# Patient Record
Sex: Male | Born: 1970 | Race: White | Hispanic: No | Marital: Married | Smoking: Former smoker
Health system: Southern US, Community
[De-identification: ages and names within clinical notes are randomized; demographics above are authoritative.]

## PROBLEM LIST (undated history)

## (undated) DIAGNOSIS — F329 Major depressive disorder, single episode, unspecified: Secondary | ICD-10-CM

## (undated) DIAGNOSIS — F32A Depression, unspecified: Secondary | ICD-10-CM

---

## 2003-02-25 ENCOUNTER — Emergency Department (HOSPITAL_COMMUNITY): Admission: EM | Admit: 2003-02-25 | Discharge: 2003-02-25 | Payer: Self-pay | Admitting: Emergency Medicine

## 2003-11-08 ENCOUNTER — Inpatient Hospital Stay (HOSPITAL_COMMUNITY): Admission: AD | Admit: 2003-11-08 | Discharge: 2003-11-11 | Payer: Self-pay | Admitting: Psychiatry

## 2003-11-09 ENCOUNTER — Encounter: Payer: Self-pay | Admitting: Family Medicine

## 2013-05-25 ENCOUNTER — Ambulatory Visit (HOSPITAL_COMMUNITY): Admission: RE | Admit: 2013-05-25 | Payer: Self-pay | Source: Home / Self Care | Admitting: Psychiatry

## 2013-05-25 ENCOUNTER — Encounter (HOSPITAL_COMMUNITY): Payer: Self-pay | Admitting: Emergency Medicine

## 2013-05-25 ENCOUNTER — Emergency Department (HOSPITAL_COMMUNITY)
Admission: EM | Admit: 2013-05-25 | Discharge: 2013-05-26 | Disposition: A | Discharge status: Other Acute Inpatient Hospital | Payer: Federal, State, Local not specified - Other | Attending: Emergency Medicine | Admitting: Emergency Medicine

## 2013-05-25 DIAGNOSIS — F332 Major depressive disorder, recurrent severe without psychotic features: Secondary | ICD-10-CM | POA: Diagnosis present

## 2013-05-25 DIAGNOSIS — F3289 Other specified depressive episodes: Secondary | ICD-10-CM | POA: Insufficient documentation

## 2013-05-25 DIAGNOSIS — R45851 Suicidal ideations: Secondary | ICD-10-CM | POA: Insufficient documentation

## 2013-05-25 DIAGNOSIS — F329 Major depressive disorder, single episode, unspecified: Secondary | ICD-10-CM | POA: Insufficient documentation

## 2013-05-25 DIAGNOSIS — Z79899 Other long term (current) drug therapy: Secondary | ICD-10-CM | POA: Insufficient documentation

## 2013-05-25 HISTORY — DX: Major depressive disorder, single episode, unspecified: F32.9

## 2013-05-25 HISTORY — DX: Depression, unspecified: F32.A

## 2013-05-25 LAB — COMPREHENSIVE METABOLIC PANEL
AST: 21 U/L (ref 0–37)
Albumin: 4.2 g/dL (ref 3.5–5.2)
BUN: 19 mg/dL (ref 6–23)
Calcium: 9.6 mg/dL (ref 8.4–10.5)
Chloride: 101 mEq/L (ref 96–112)
Creatinine, Ser: 0.86 mg/dL (ref 0.50–1.35)
Sodium: 137 mEq/L (ref 135–145)

## 2013-05-25 LAB — CBC
HCT: 43.5 % (ref 39.0–52.0)
MCH: 29.9 pg (ref 26.0–34.0)
MCHC: 35.9 g/dL (ref 30.0–36.0)
MCV: 83.5 fL (ref 78.0–100.0)
Platelets: 250 10*3/uL (ref 150–400)
RDW: 12.3 % (ref 11.5–15.5)
WBC: 7.8 10*3/uL (ref 4.0–10.5)

## 2013-05-25 LAB — SALICYLATE LEVEL: Salicylate Lvl: <2 mg/dL — ABNORMAL LOW (ref 2.8–20.0)

## 2013-05-25 LAB — ETHANOL: Alcohol, Ethyl (B): <11 mg/dL (ref 0–11)

## 2013-05-25 MED ORDER — ALUM & MAG HYDROXIDE-SIMETH 200-200-20 MG/5ML PO SUSP: 30.0000 mL | ORAL | Status: DC | PRN

## 2013-05-25 MED ORDER — LORAZEPAM 1 MG PO TABS
1.0000 mg | ORAL_TABLET | Freq: Three times a day (TID) | ORAL | Status: DC | PRN
  Administered 2013-05-25: 1 mg via ORAL
  Filled 2013-05-25: qty 1

## 2013-05-25 MED ORDER — NICOTINE 21 MG/24HR TD PT24: 21.0000 mg | MEDICATED_PATCH | Freq: Every day | TRANSDERMAL | Status: DC

## 2013-05-25 MED ORDER — ZOLPIDEM TARTRATE 5 MG PO TABS: 5.0000 mg | ORAL_TABLET | Freq: Every evening | ORAL | Status: DC | PRN

## 2013-05-25 MED ORDER — ONDANSETRON HCL 4 MG PO TABS: 4.0000 mg | ORAL_TABLET | Freq: Three times a day (TID) | ORAL | Status: DC | PRN

## 2013-05-25 MED ORDER — ACETAMINOPHEN 325 MG PO TABS: 650.0000 mg | ORAL_TABLET | ORAL | Status: DC | PRN

## 2013-05-25 MED ORDER — IBUPROFEN 200 MG PO TABS: 600.0000 mg | ORAL_TABLET | Freq: Three times a day (TID) | ORAL | Status: DC | PRN

## 2013-05-25 NOTE — ED Notes (Signed)
Patient presents pleasant, calm and cooperative; denies any current thoughts of self harm or thoughts of harming any one else; denies any auditory or visual hallucinations; denies any pain.

## 2013-05-25 NOTE — ED Notes (Signed)
Pt states that he has long history of depression and his friends have been worried about him so they wanted him to be seen for help. Pt was sent here from [facility] fro medical clearance but doesn't currently have a bed over there.

## 2013-05-25 NOTE — ED Provider Notes (Signed)
CSN: [id number]     Arrival date & time 05/25/13  1836 History  This chart was scribed for Marc Holloway, [place], with Audree Camel, MD, by Ardelia Mems ED Scribe. This patient was seen in room [facility]3/[facility] and the patient's care was started at 9:00 PM.    Chief Complaint  Patient presents with  . Medical Clearance    The history is provided by the patient. No language interpreter was used.    HPI Comments: Marc Holloway is a 42 y.o. Male with a history of depression who presents to the Emergency Department complaining of SI onset today. He states that he has a long history of depression but that he has no prior SI. He states that he was researching ways to commit suicide on the internet today, but that he backed out and "changed his mind", after reading that suicide attempts often fail. He states that his spouse saw the internet history from his searches today and requested that he be evaluated. He states that he has no history of suicide attempts. He states that he has no new stressors in his life. He states that he has no medical problems. He states that he takes Wellbutrin, which he states he doesn't believe is working. He states that he believes he would benefit from a change in medications. He states that he has been drug and alcohol free since 2006.    Past Medical History  Diagnosis Date  . Depression    History reviewed. No pertinent past surgical history. No family history on file. History  Substance Use Topics  . Smoking status: Never Smoker   . Smokeless tobacco: Never Used  . Alcohol Use: No    Review of Systems  Psychiatric/Behavioral: Positive for suicidal ideas. Negative for self-injury.       Denies HI.  All other systems reviewed and are negative.   Allergies  Review of patient's allergies indicates no known allergies.  Home Medications   Current Outpatient Rx  Name  Route  Sig  Dispense  Refill  . buPROPion (WELLBUTRIN SR) 150 MG 12 hr tablet   Oral    Take 150 mg by mouth daily.         . naproxen (NAPROSYN) 250 MG tablet   Oral   Take 375 mg by mouth 2 (two) times daily as needed (pain).          Triage Vitals: BP 125/84  Pulse 78  Temp(Src) 97.9 F (36.6 C) (Oral)  Resp 17  Ht 5\' 11"  (1.803 m)  Wt 205 lb (92.987 kg)  BMI 28.6 kg/m2  SpO2 99%  Physical Exam  Nursing note and vitals reviewed. Constitutional: He appears well-developed and well-nourished.  HENT:  Head: Normocephalic and atraumatic.  Eyes: Conjunctivae are normal. No scleral icterus.  Neck: Normal range of motion.  Cardiovascular: Normal rate, regular rhythm and normal heart sounds.   Pulmonary/Chest: Effort normal and breath sounds normal. No respiratory distress. He has no wheezes. He has no rales. He exhibits no tenderness.  Abdominal: Soft. Bowel sounds are normal. He exhibits no distension and no mass. There is no tenderness. There is no rebound and no guarding.  Musculoskeletal: Normal range of motion.  Neurological: He is alert.  Skin: Skin is warm and dry.  Psychiatric: He is not agitated and not aggressive. He exhibits a depressed mood. He expresses suicidal ideation. He expresses no homicidal ideation. He expresses no suicidal plans.    ED Course  Procedures (including critical care time)  DIAGNOSTIC STUDIES: Oxygen Saturation is 99% on RA, normal by my interpretation.    COORDINATION OF CARE: 9:05 PM- Pt advised of plan for treatment and pt agrees.  Labs Review Labs Reviewed  SALICYLATE LEVEL - Abnormal; Notable for the following:    Salicylate Lvl <2.0 (*)    All other components within normal limits  ACETAMINOPHEN LEVEL  CBC  COMPREHENSIVE METABOLIC PANEL  ETHANOL  URINE RAPID DRUG SCREEN (HOSP PERFORMED)   Imaging Review No results found.  EKG Interpretation   None       MDM   1. Suicidal ideation    Pt is medically clear.  Pt does have SI without a plan, however triage notes letter was found by his significant other  and pt stated he wanted his funeral and body cremated.  Psych Hold orders placed and call to TTS ordered. Pt moved to [facility] for further evaluation.  Disposition to be determined by [facility].  I personally performed the services described in this documentation, which was scribed in my presence. The recorded information has been reviewed and is accurate.    Marc Finner, PA-C 05/26/13 [id number]

## 2013-05-25 NOTE — BH Assessment (Signed)
Assessment Note  Marc Holloway is an 42 y.o. male who presents to the ED with suicidal ideations.  Pt currently lives with his partner and can return home once medically stable.  Pt denies HI/AH/VH.  Pt reports that he is here because his partner found a suicide note on the computer that he had written as well as a search history of how to shoot yourself.  Pt reports "once I read that most suicide attempts fail, and people just end up hurting themselves, I changed my mind".  Pt reports having lived with depression most of his life.  Pt reports some days "I'm fine and others I'm low".  Pt denies any particular triggers for his depression or thoughts of suicide.  Pt denies having attempted suicide, but reports that he has thought about it often.  Pt reports "I don't see any hope of anything getting any better".  Pt reports that he feels exhausted when he is feeling down but that "I still get up and go to work".  Pt reports that his appetite is fine and he has just started a diet "to not eat anything white".  Pt reports that he wakes up at least twice in an 8 hour period of sleeping.  Pt reports that his family is not supportive, and that "my partner and my sister try to be, but they don't really understand".  Pt reports that he has tried many medications for his depression and don't feel that they are effective.  Currently taking Wellbutrin.  Pt reports that he has tried talk therapy but doesn't feel that its effective.  Last saw a therapist 06/2012. Pt has a history of alcohol and crack use, but has not used since 2006.  CSW spoke with pts partner, Loraine Leriche and their friend Lanora Manis who accompanied pt to the ED.  Per Loraine Leriche, pt has been different over the last few days, and has been struggling with depression for a long time.  Loraine Leriche reports that the suicide note that he found was very detailed and "not something that he just wrote this morning".  Lanora Manis reports that although pt does not own a gun, that he did  report to her that he went shopping for one "right after Brandt Loosen died".     CSW consulted with [facility] extender, Donell Sievert who recommends inpatient treatment to [facility] pending the availability of a 500 hall bed.     Axis I: Major Depression, Recurrent severe Axis II: Deferred Axis III:  Past Medical History  Diagnosis Date  . Depression    Axis IV: other psychosocial or environmental problems, problems related to social environment and problems with primary support group Axis V: 31-40 impairment in reality testing  Past Medical History:  Past Medical History  Diagnosis Date  . Depression     History reviewed. No pertinent past surgical history.  Family History: No family history on file.  Social History:  reports that he has never smoked. He has never used smokeless tobacco. He reports that he does not drink alcohol or use illicit drugs.  Additional Social History:  Alcohol / Drug Use History of alcohol / drug use?: Yes Substance #1 Name of Substance 1: alcohol  1 - Age of First Use: 12 1 - Amount (size/oz): binge drinker 1 - Frequency: 3-4 times a week 1 - Duration: years 1 - Last Use / Amount: 10/14/2004 Substance #2 Name of Substance 2: Crack 2 - Age of First Use: 23 2 - Duration: years 2 - Last  Use / Amount: 10/19/2004  CIWA: CIWA-Ar BP: 125/84 mmHg Pulse Rate: 78 COWS:    Allergies: No Known Allergies  Home Medications:  (Not in a hospital admission)  OB/GYN Status:  No LMP for male patient.  General Assessment Data Location of Assessment: [facility] Is this a Tele or Face-to-Face Assessment?: Face-to-Face Is this an Initial Assessment or a Re-assessment for this encounter?: Initial Assessment Living Arrangements: Spouse/significant other Can pt return to current living arrangement?: Yes Admission Status: Voluntary Is patient capable of signing voluntary admission?: Yes Transfer from: Home Referral Source: Self/Family/Friend     [facility] Crisis Care  Plan Living Arrangements: Spouse/significant other     Risk to self Suicidal Ideation: No-Not Currently/Within Last 6 Months Suicidal Intent: No-Not Currently/Within Last 6 Months Is patient at risk for suicide?: Yes Suicidal Plan?: Yes-Currently Present Specify Current Suicidal Plan:  (pt was researching how to shoot himself on internet) Access to Means: No What has been your use of drugs/alcohol within the last 12 months?:  (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) Previous Attempts/Gestures: No How many times?: 0 Other Self Harm Risks: 0 Triggers for Past Attempts: None known Family Suicide History: Unknown Recent stressful life event(s): Other (Comment) (work stress) Persecutory voices/beliefs?: No Depression Symptoms: Insomnia;Fatigue;Guilt Substance abuse history and/or treatment for substance abuse?: Yes  Risk to Others Homicidal Ideation: No Thoughts of Harm to Others: No Current Homicidal Intent: No Current Homicidal Plan: No Access to Homicidal Means: No Identified Victim: none History of harm to others?: No Assessment of Violence: None Noted Violent Behavior Description: none Does patient have access to weapons?: No Criminal Charges Pending?: No Does patient have a court date: No  Psychosis Hallucinations: None noted Delusions: None noted  Mental Status Report Appear/Hygiene:  (unremarkable) Eye Contact: Fair Motor Activity: Agitation;Freedom of movement Speech: Logical/coherent Level of Consciousness: Alert Mood: Depressed Affect: Depressed Anxiety Level: None Thought Processes: Coherent;Relevant Judgement: Impaired Orientation: Person;Place;Time;Situation;Appropriate for developmental age Obsessive Compulsive Thoughts/Behaviors: None  Cognitive Functioning Concentration: Normal Memory: Recent Intact;Remote Intact IQ: Average Insight: Poor Impulse Control: Fair Appetite: Good Weight Loss: 0 Weight Gain: 0 Sleep: Decreased Total Hours of  Sleep: 3 (pt reports he gets a total 8 hours but wakes up after 3 hour) Vegetative Symptoms: None  ADLScreening [facility]) Patient's cognitive ability adequate to safely complete daily activities?: Yes Patient able to express need for assistance with ADLs?: Yes Independently performs ADLs?: Yes (appropriate for developmental age)  Prior Inpatient Therapy Prior Inpatient Therapy: Yes [facility]) Prior Therapy Dates:  (2006) Prior Therapy Facilty/Provider(s):  [facility]) Reason for Treatment:  (drug and alcohol treatment)  Prior Outpatient Therapy Prior Outpatient Therapy: Yes (pt reports he has seen many different therapist) Prior Therapy Dates: 2013 Prior Therapy Facilty/Provider(s):  [facility]) Reason for Treatment:  (Depression)  ADL Screening (condition at time of admission) Patient's cognitive ability adequate to safely complete daily activities?: Yes Patient able to express need for assistance with ADLs?: Yes Independently performs ADLs?: Yes (appropriate for developmental age)         Values / Beliefs Cultural Requests During Hospitalization: None Spiritual Requests During Hospitalization: None        Additional Information 1:1 In Past 12 Months?: No CIRT Risk: No Elopement Risk: No Does patient have medical clearance?: Yes     Disposition:  Disposition Initial Assessment Completed for this Encounter: Yes Disposition of Patient: Inpatient treatment program Type of inpatient treatment program: Adult  On Site Evaluation by:   Reviewed with  Physician:    Lexine Baton 05/25/2013 10:40 PM

## 2013-05-25 NOTE — ED Notes (Signed)
Pt wrote a note earlier today stating how he wanted his funeral and body cremated. Letter was found by his significant other.

## 2013-05-26 ENCOUNTER — Encounter (HOSPITAL_COMMUNITY): Payer: Self-pay

## 2013-05-26 ENCOUNTER — Encounter (HOSPITAL_COMMUNITY): Payer: Self-pay | Admitting: Registered Nurse

## 2013-05-26 ENCOUNTER — Inpatient Hospital Stay (HOSPITAL_COMMUNITY)
Admission: AD | Admit: 2013-05-26 | Discharge: 2013-05-31 | DRG: 885 | Disposition: A | Discharge status: Home / Self Care | Payer: Federal, State, Local not specified - Other | Source: Intra-hospital | Attending: Psychiatry | Admitting: Psychiatry

## 2013-05-26 DIAGNOSIS — R45851 Suicidal ideations: Secondary | ICD-10-CM

## 2013-05-26 DIAGNOSIS — F332 Major depressive disorder, recurrent severe without psychotic features: Secondary | ICD-10-CM | POA: Diagnosis present

## 2013-05-26 DIAGNOSIS — Z79899 Other long term (current) drug therapy: Secondary | ICD-10-CM

## 2013-05-26 LAB — RAPID URINE DRUG SCREEN, HOSP PERFORMED
Amphetamines: NOT DETECTED
Barbiturates: NOT DETECTED
Benzodiazepines: NOT DETECTED
Cocaine: NOT DETECTED
Opiates: NOT DETECTED
Tetrahydrocannabinol: NOT DETECTED

## 2013-05-26 MED ORDER — BUPROPION HCL ER (XL) 150 MG PO TB24
150.0000 mg | ORAL_TABLET | Freq: Every day | ORAL | Status: DC
  Administered 2013-05-27 – 2013-05-28 (×2): 150 mg via ORAL
  Filled 2013-05-26 (×4): qty 1

## 2013-05-26 MED ORDER — BUPROPION HCL ER (XL) 150 MG PO TB24
150.0000 mg | ORAL_TABLET | Freq: Every day | ORAL | Status: DC
  Administered 2013-05-26: 150 mg via ORAL
  Filled 2013-05-26: qty 1

## 2013-05-26 MED ORDER — ALUM & MAG HYDROXIDE-SIMETH 200-200-20 MG/5ML PO SUSP
30.0000 mL | ORAL | Status: DC | PRN
  Administered 2013-05-29: 30 mL via ORAL

## 2013-05-26 MED ORDER — TRAZODONE HCL 50 MG PO TABS
50.0000 mg | ORAL_TABLET | Freq: Every evening | ORAL | Status: DC | PRN
  Administered 2013-05-26: 50 mg via ORAL
  Filled 2013-05-26 (×15): qty 1

## 2013-05-26 MED ORDER — MAGNESIUM HYDROXIDE 400 MG/5ML PO SUSP: 30.0000 mL | Freq: Every day | ORAL | Status: DC | PRN

## 2013-05-26 MED ORDER — ACETAMINOPHEN 325 MG PO TABS: 650.0000 mg | ORAL_TABLET | Freq: Four times a day (QID) | ORAL | Status: DC | PRN

## 2013-05-26 NOTE — Progress Notes (Signed)
Adult Psychoeducational Group Note  Date:  05/26/2013 Time:  10:26 PM  Group Topic/Focus:  Wrap-Up Group:   The focus of this group is to help patients review their daily goal of treatment and discuss progress on daily workbooks.  Participation Level:  Active  Participation Quality:  Appropriate  Affect:  Appropriate  Cognitive:  Appropriate  Insight: Appropriate  Engagement in Group:  Engaged  Modes of Intervention:  Discussion  Additional Comments:The patient had just arrived from [facility]  Octavio Manns 05/26/2013, 10:26 PM
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
## 2013-05-26 NOTE — Progress Notes (Addendum)
Patient has been accepted to [facility] to Bed 506-1.  Incoming TTS will complete support paperwork.

## 2013-05-26 NOTE — ED Provider Notes (Signed)
Medical screening examination/treatment/procedure(s) were performed by non-physician practitioner and as supervising physician I was immediately available for consultation/collaboration.  EKG Interpretation   None         Ladarian Bonczek T Raywood Wailes, MD 05/26/13 1116 

## 2013-05-26 NOTE — Progress Notes (Signed)
[facility] provided pt with a [employer] of the [place].
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
## 2013-05-27 DIAGNOSIS — R45851 Suicidal ideations: Secondary | ICD-10-CM

## 2013-05-27 DIAGNOSIS — F1021 Alcohol dependence, in remission: Secondary | ICD-10-CM

## 2013-05-27 DIAGNOSIS — F332 Major depressive disorder, recurrent severe without psychotic features: Principal | ICD-10-CM

## 2013-05-27 MED ORDER — ARIPIPRAZOLE 5 MG PO TABS
5.0000 mg | ORAL_TABLET | Freq: Every day | ORAL | Status: DC
  Administered 2013-05-27 – 2013-05-28 (×2): 5 mg via ORAL
  Filled 2013-05-27 (×4): qty 1

## 2013-05-27 NOTE — Progress Notes (Signed)
Adult Psychoeducational Group Note  Date:  05/27/2013 Time:  11:00AM Group Topic/Focus:  Leisure and Lifestyle Changes  Participation Level:  Active  Participation Quality:  Appropriate and Attentive  Affect:  Appropriate  Cognitive:  Alert and Appropriate  Insight: Appropriate  Engagement in Group:  Engaged  Modes of Intervention:  Discussion  Additional Comments:  Pt. Was attentive and appropriate during today's group discussion. Pt was able to complete worksheet on self esteem. Pt. Was able to list positive thing about yourself and something you've done or good quality you have. Pt was able to work in group and come up with the following words loved, wellness and proud.    Bing Plume D 05/27/2013, 4:14 PM

## 2013-05-27 NOTE — Progress Notes (Signed)
D: Pt reports to be adjusting to the unit appropriately. He is observed participating in groups and interacting with others well. He is denying any SI/HI at this time. He has no concerns he wishes to address with this writer to at this time.  A: Pt was informed of continued order of Wellbutrin from home until further notice. Writer administered a prn dose of Trazodone for sleep. Continued support and availability as needed was extended to this pt. Staff continue to monitor pt with q59min checks.  R: No adverse drug reactions noted. Pt receptive to treatment. Pt remains safe at this time.

## 2013-05-27 NOTE — Progress Notes (Signed)
The focus of this group is to educate the patient on the purpose and policies of crisis stabilization and provide a format to answer questions about their admission.  The group details unit policies and expectations of patients while admitted.  Patient attended 0900 nurse education orientation group.  Patient actively participated, appropriate affect, alert, appropriate insight and engagement.  Today patient will work on goals for discharge.

## 2013-05-27 NOTE — [facility] Group Notes (Signed)
[facility] LCSW Group Therapy  [facility] 1:15 - 2:30 PM  05/27/2013 2:52 PM   Type of Therapy:  Group Therapy  Participation Level:  Minimal  Participation Quality:  Attentive  Affect:  Appropriate  Cognitive:  Appropriate  Insight:  Developing/Improving   Engagement in Therapy:  Developing/Improving   Modes of Intervention:  Discussion, Education, Exploration, Problem-Solving, Rapport Building, Support   Summary of Progress/Problems:  Patient listened attentively to speaker from [facility]. He made no comments on the presentaton  Wynn Banker 05/27/2013 2:52 PM

## 2013-05-27 NOTE — Progress Notes (Signed)
D:  Patient's self inventory sheet, patient has poor sleep, good appetite, low energy level, good attention span.  Rated depression 5, hopeless 6, denied anxiety.  Denied withdrawals.  SI, contracts for safety.  Has experienced headache in past 24 hours.  Worst pain 2.  After discharge, plans to go to more meetings AA, stay off sugar and wheat.   A:  Medications administered per MD orders.  Emotional support and encouragement given patient. R:  Denied HI.  Denied A/V hallucinations.  Will continue to monitor for safety with 15 minute checks.  Safety maintained. SI off/on, contracts for safety. \
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
## 2013-05-27 NOTE — Progress Notes (Signed)
Patient observed resting in bed. Patient stated that he had trouble sleeping the previous night. Patient attended groups. Patient states "I had a productive day. I completed the packet and did additional journaling". Patient states that he will work on building his support system. Denies SI, HI, AVH. Rates anxiety and depression at 0/10.  Encouragement offered. Patient declined Trazadone stating that he felt it was ineffective.  Patient safety maintained on the unit. Q 15 minute checks continue.

## 2013-05-28 MED ORDER — HYDROXYZINE HCL 50 MG PO TABS
50.0000 mg | ORAL_TABLET | Freq: Every evening | ORAL | Status: DC | PRN
  Administered 2013-05-29 – 2013-05-30 (×2): 50 mg via ORAL
  Filled 2013-05-28 (×2): qty 1
  Filled 2013-05-28: qty 14

## 2013-05-28 MED ORDER — BUPROPION HCL ER (XL) 300 MG PO TB24
300.0000 mg | ORAL_TABLET | Freq: Every day | ORAL | Status: DC
  Administered 2013-05-29 – 2013-05-31 (×3): 300 mg via ORAL
  Filled 2013-05-28 (×2): qty 1
  Filled 2013-05-28: qty 14
  Filled 2013-05-28 (×2): qty 1

## 2013-05-28 NOTE — Progress Notes (Signed)
[facility] MD Progress Note  05/28/2013 1:12 PM Marc Holloway  MRN:  [id number] Subjective:  Patient stated that he is feeling less anxious but continued to be depressed. Patient stated he spoke with his partner who has cleared his dogs about getting married and now he feels better. Patient requested to increase his Wellbutrin and stop his Abilify because of cost constraint  after discharged. Patient endorses symptoms of depression and anxiety but the contract for safety regarding suicidal ideation.  Diagnosis:   DSM5: Schizophrenia Disorders:   Obsessive-Compulsive Disorders:   Trauma-Stressor Disorders:   Substance/Addictive Disorders:   Depressive Disorders:  Major Depressive Disorder - Severe (296.23)  Axis I: Major Depression, Recurrent severe  ADL's:  Intact  Sleep: Fair  Appetite:  Fair  Suicidal Ideation:  Endorses suicidal ideation but contracts for safety in the hospital Homicidal Ideation:  Denied AEB (as evidenced by):  Psychiatric Specialty Exam: ROS  Blood pressure 135/78, pulse 114, temperature 97 F (36.1 C), temperature source Oral, resp. rate 16, height 5\' 10"  (1.778 m), weight 94.348 kg (208 lb).Body mass index is 29.84 kg/(m^2).  General Appearance: Fairly Groomed and Guarded  Patent attorney::  Minimal  Speech:  Clear and Coherent and Slow  Volume:  Decreased  Mood:  Anxious, Depressed, Hopeless and Worthless  Affect:  Depressed and Flat  Thought Process:  Goal Directed and Intact  Orientation:  Full (Time, Place, and Person)  Thought Content:  Rumination  Suicidal Thoughts:  Yes.  with intent/plan  Homicidal Thoughts:  No  Memory:  Immediate;   Fair  Judgement:  Intact  Insight:  Fair  Psychomotor Activity:  Psychomotor Retardation  Concentration:  Fair  Recall:  Fair  Akathisia:  NA  Handed:  Right  AIMS (if indicated):     Assets:  Communication Skills Desire for Improvement Financial Resources/Insurance Physical Health Social Support  Sleep:   Number of Hours: 6.75   Current Medications: Current Facility-Administered Medications  Medication Dose Route Frequency Provider Last Rate Last Dose  . acetaminophen (TYLENOL) tablet 650 mg  650 mg Oral Q6H PRN Shuvon Rankin, NP      . alum & mag hydroxide-simeth (MAALOX/MYLANTA) 200-200-20 MG/5ML suspension 30 mL  30 mL Oral Q4H PRN Shuvon Rankin, NP      . [START ON 05/29/2013] buPROPion (WELLBUTRIN XL) 24 hr tablet 300 mg  300 mg Oral Daily Nehemiah Settle, MD      . hydrOXYzine (ATARAX/VISTARIL) tablet 50 mg  50 mg Oral QHS PRN Nehemiah Settle, MD      . magnesium hydroxide (MILK OF MAGNESIA) suspension 30 mL  30 mL Oral Daily PRN Shuvon Rankin, NP      . traZODone (DESYREL) tablet 50 mg  50 mg Oral QHS,MR X 1 Spencer E Simon, PA-C   50 mg at 05/26/13 2126    Lab Results: No results found for this or any previous visit (from the past 48 hour(s)).  Physical Findings: AIMS: Facial and Oral Movements Muscles of Facial Expression: None, normal Lips and Perioral Area: None, normal Jaw: None, normal Tongue: None, normal,Extremity Movements Upper (arms, wrists, hands, fingers): None, normal Lower (legs, knees, ankles, toes): None, normal, Trunk Movements Neck, shoulders, hips: None, normal, Overall Severity Severity of abnormal movements (highest score from questions above): None, normal Incapacitation due to abnormal movements: None, normal Patient's awareness of abnormal movements (rate only patient's report): No Awareness, Dental Status Current problems with teeth and/or dentures?: No Does patient usually wear dentures?: No  CIWA:  CIWA-Ar  Total: 1 COWS:  COWS Total Score: 2  Treatment Plan Summary: Daily contact with patient to assess and evaluate symptoms and progress in treatment Medication management  Plan: Treatment Plan/Recommendations:   1. Admit for crisis management and stabilization. 2. Medication management to reduce current symptoms to base line  and improve the patient's overall level of functioning. Increase Wellbutrin to 300 mg daily continue trazodone 50 mg at bedtime and discontinue Abilify. 3. Treat health problems as indicated. 4. Develop treatment plan to decrease risk of relapse upon discharge and to reduce the need for readmission. 5. Psycho-social education regarding relapse prevention and self care. 6. Health care follow up as needed for medical problems. 7. Restart home medications where appropriate.   Medical Decision Making Problem Points:  Established problem, worsening (2), Review of last therapy session (1) and Review of psycho-social stressors (1) Data Points:  Review or order clinical lab tests (1) Review or order medicine tests (1) Review of medication regiment & side effects (2) Review of new medications or change in dosage (2)  I certify that inpatient services furnished can reasonably be expected to improve the patient's condition.   Marc Holloway,Marc R. 05/28/2013, 1:12 PM

## 2013-05-28 NOTE — Progress Notes (Signed)
Patient ID: Marc Holloway, male   DOB: 05/10/1971, 42 y.o.   MRN: [id number]  D: Patient pleasant and cooperative with assessment, but does endorse depression with flat affect. Pt denies SI at this time. No s/s of distress noted. A: Q 15 minute safety checks for safety, encourage staff/peer interaction, group participation, and administer medications as ordered by MD. R: Patient declined to take his Trazodone due to stating that he did not want to feel groggy tomorrow. Pt verbally contracts for safety.

## 2013-05-28 NOTE — Tx Team (Addendum)
Interdisciplinary Treatment Plan Update   Date Reviewed:  05/28/2013  Time Reviewed:  9:40 AM  Progress in Treatment:   Attending groups: Yes Participating in groups: Yes Taking medication as prescribed: Yes, contact made with partner. Tolerating medication: Yes Family/Significant other contact made: Yes  Patient understands diagnosis: Yes  Discussing patient identified problems/goals with staff: Yes Medical problems stabilized or resolved: Yes Denies suicidal/homicidal ideation: Yes Patient has not harmed self or others: Yes  For review of initial/current patient goals, please see plan of care.  Estimated Length of Stay: 3-4 days   Reasons for Continued Hospitalization:  Anxiety Depression Medication stabilization  New Problems/Goals identified:    Discharge Plan or Barriers:   Home with outpatient follow up with [facility]  Additional Comments:  Marc Holloway is a 42 year old white male who presented to the [facility] at the request of his partner who found him searching on the internet for ways to commit suicide. Braian endorses a "lifetime" of depression but states his symptoms have worsened over the past several months to include feeling helpless, with racing thoughts, crying, inability to make a decision, increased fatigue with no motivation, increased anhedonia, mood swings with irritability and being over whelmed with what others think of him. He states he does not have a plan to kill himself reporting that his research shows suicide attempts are more likely to end up in injury than death. He states suicide is not an option for him.   Attendees:  Patient:  05/28/2013 9:40 AM   Signature: Mervyn Gay, MD 05/28/2013 9:40 AM  Signature:  Verne Spurr, PA 05/28/2013 9:40 AM  Signature: Lamount Cranker, RN  05/28/2013 9:40 AM  Signature: 05/28/2013 9:40 AM  Signature:   05/28/2013 9:40 AM  Signature:  Juline Patch, LCSW 05/28/2013 9:40 AM   Signature:   05/28/2013 9:40 AM  Signature:  05/28/2013 9:40 AM  Signature:   05/28/2013 9:40 AM  Signature:  05/28/2013  9:40 AM  Signature:    05/28/2013  9:40 AM  Signature:   05/28/2013  9:40 AM    Scribe for Treatment Team:   Juline Patch,  05/28/2013 9:40 AM

## 2013-05-28 NOTE — [facility] Group Notes (Signed)
[facility] LCSW Aftercare Discharge Planning Group Note   05/28/2013 11:52 AM    Participation Quality:  Appropraite  Mood/Affect:  Appropriate  Depression Rating:  2  Anxiety Rating:  0  Thoughts of Suicide:  No  Will you contract for safety?   NA  Current AVH:  No  Plan for Discharge/Comments:  Patient attending discharge planning group and actively participated in group.  He advised of needing referral for outpatient services.  CSW provided all participants with daily workbook.  Transportation Means: Patient has transportation.   Supports:  Patient has a support system.   Marc Holloway, Joesph July

## 2013-05-28 NOTE — [facility] Suicide Risk Assessment (Signed)
[facility] INPATIENT:  Family/Significant Other Suicide Prevention Education  Suicide Prevention Education:  Education Completed; Mikey Bussing, Partner, [phone]; has been identified by the patient as the family member/significant other with whom the patient will be residing, and identified as the person(s) who will aid the patient in the event of a mental health crisis (suicidal ideations/suicide attempt).  With written consent from the patient, the family member/significant other has been provided the following suicide prevention education, prior to the and/or following the discharge of the patient.  The suicide prevention education provided includes the following:  Suicide risk factors  Suicide prevention and interventions  National Suicide Hotline telephone number  [facility] assessment telephone number  [facility] Emergency Assistance 911  [facility] and/or Residential Mobile Crisis Unit telephone number  Request made of family/significant other to:  Remove weapons (e.g., guns, rifles, knives), all items previously/currently identified as safety concern.  Partner advised patient does not have access to weapons.  Remove drugs/medications (over-the-counter, prescriptions, illicit drugs), all items previously/currently identified as a safety concern.  The family member/significant other verbalizes understanding of the suicide prevention education information provided.  The family member/significant other agrees to remove the items of safety concern listed above.  Wynn Banker 05/28/2013, 8:32 AM

## 2013-05-28 NOTE — Progress Notes (Signed)
Adult Psychoeducational Group Note  Date:  05/28/2013 Time:  10:00am Group Topic/Focus:  Therapeutic Activity  Participation Level:  Active  Participation Quality:  Appropriate and Attentive  Affect:  Appropriate  Cognitive:  Alert and Appropriate  Insight: Appropriate  Engagement in Group:  Engaged  Modes of Intervention:  Discussion and Education  Additional Comments:  Patient attended and participated in group. Activity today was activity ball. Pt was asked to answer any question on the ball. Pt answered the question What is a positive short term goal? Pt stated to break the pattern of isolation and attend more AA meetings.  Shelly Bombard D 05/28/2013, 11:25 AM

## 2013-05-28 NOTE — [facility] Group Notes (Signed)
[facility] LCSW Group Therapy  Feelings Around Relapse 1:15 -2:30          05/28/2013  4:09 PM   Type of Therapy:  Group Therapy  Participation Level:  Appropriate  Participation Quality:  Appropriate  Affect:  Appropriate  Cognitive:  Attentive Appropriate  Insight:  Developing/Improving  Engagement in Therapy: Developing/Improving  Modes of Intervention:  Discussion Exploration Problem-Solving Supportive  Summary of Progress/Problems:  The topic for today was feelings around relapse.    Patient processed feelings toward relapse and was able to relate to peers. He shared relapse for him means isolating from partner and friends.  Patient identified coping skills that can be used to prevent a relapse.   Wynn Banker 05/28/2013    4:09 PM

## 2013-05-28 NOTE — Progress Notes (Signed)
Adult Psychoeducational Group Note  Date:  05/28/2013 Time:  8:00pm Group Topic/Focus:  Wrap-Up Group:   The focus of this group is to help patients review their daily goal of treatment and discuss progress on daily workbooks.  Participation Level:  Active  Participation Quality:  Appropriate and Attentive  Affect:  Appropriate  Cognitive:  Alert and Appropriate  Insight: Appropriate  Engagement in Group:  Engaged  Modes of Intervention:  Discussion and Education  Additional Comments: Pt attended and participated in group. Writer explained to each patient about 15 minute checks, environmental, and keeping the noise level at a minimal. Pt was asked How his day went? Pt stated it was a good day but the new medicine he is on made him really sleepy.    Shelly Bombard D 05/28/2013, 9:38 PM

## 2013-05-29 DIAGNOSIS — F411 Generalized anxiety disorder: Secondary | ICD-10-CM

## 2013-05-29 MED ORDER — IBUPROFEN 600 MG PO TABS
600.0000 mg | ORAL_TABLET | Freq: Four times a day (QID) | ORAL | Status: DC | PRN
  Administered 2013-05-30 – 2013-05-31 (×3): 600 mg via ORAL
  Filled 2013-05-29 (×3): qty 1

## 2013-05-29 NOTE — Progress Notes (Signed)
Late entry for 11-31-14 @ 1300 D) Pt has been attending the groups and interacting with select peers. Denies SI and HI. Rates his depression at a 2 and his hopelessness at a 1.  A) Given support and reassurance. Encouraged to go to the groups and to journal. R) Denies SI and HI.

## 2013-05-29 NOTE — [facility] Group Notes (Signed)
[facility] Group Notes:  (Clinical Social Work)  05/29/2013   3:00-4:00PM  Summary of Progress/Problems:   The main focus of today's process group was for the patient to identify ways in which they have sabotaged their own mental health wellness/recovery.  Motivational interviewing was used to explore the reasons they engage in this behavior, and reasons they may have for wanting to change.  The Stages of Change were explained to the group using a handout, and patients identified where they are with regard to changing self-defeating behaviors.  The patient expressed that he self sabotages by having high expectations of himself and everybody in his life, as well as isolating himself.  He has been spending time in the hospital contemplating these things, and coming up with different ways to react, has been practicing his new techniques here in the hospital.  Type of Therapy:  Process Group  Participation Level:  Active  Participation Quality:  Attentive and Sharing  Affect:  Blunted  Cognitive:  Appropriate and Oriented  Insight:  Engaged  Engagement in Therapy:  Engaged  Modes of Intervention:  Education, Motivational Interviewing   Ambrose Mantle, LCSW 05/29/2013, 1:57 PM

## 2013-05-29 NOTE — Progress Notes (Signed)
Psychoeducational Group Note  Date: 05/29/2013 Time:  1015  Group Topic/Focus:  Identifying Needs:   The focus of this group is to help patients identify their personal needs that have been historically problematic and identify healthy behaviors to address their needs.  Participation Level:  Active  Participation Quality:  Attentive  Affect:  Appropriate  Cognitive:  Oriented  Insight:  Improving  Engagement in Group:  Engaged  Additional Comments:    Alda Gaultney A 

## 2013-05-29 NOTE — Progress Notes (Signed)
Adult Psychoeducational Group Note  Date:  05/29/2013 Time: 8:00pm Group Topic/Focus:  Wrap-Up Group:   The focus of this group is to help patients review their daily goal of treatment and discuss progress on daily workbooks.  Participation Level:  Active  Participation Quality:  Appropriate and Attentive  Affect:  Appropriate  Cognitive:  Alert and Appropriate  Insight: Appropriate  Engagement in Group:  Engaged  Modes of Intervention:  Discussion and Education  Additional Comments:  Pt attended and participated in group. Environmental rounds, Q 15 minute checks medication issues and rules for group were explained. The Question for wrap up group was, How was your day? Pt stated he had a good day he was more social and slept well last night.  Shelly Bombard D 05/29/2013, 9:36 PM

## 2013-05-29 NOTE — Progress Notes (Signed)
[facility] Group Notes:  (Nursing/MHT/Case Management/Adjunct)  Date:  05/29/2013  Time:  2:33 PM  Type of Therapy:  Psychoeducational Skills  Participation Level:  Active  Participation Quality:  Appropriate  Affect:  Appropriate  Cognitive:  Appropriate  Insight:  Appropriate  Engagement in Group:  Engaged  Modes of Intervention:  Activity  Summary of Progress/Problems:  Marc Holloway 05/29/2013, 2:33 PM 

## 2013-05-29 NOTE — Progress Notes (Signed)
[facility] MD Progress Note  05/29/2013 2:57 PM Marc Holloway  MRN:  [id number] Subjective:  Slept "well last night", appetite is "good", depression is better, feels "numb", working on journaling, suicidal ideations "on and off", right hand pain due to osteoarthritis  Diagnosis:   DSM5:  Depressive Disorders:  Major Depressive Disorder - Severe (296.23)  Axis I: Anxiety Disorder NOS and Major Depression, Recurrent severe Axis II: Deferred Axis III:  Past Medical History  Diagnosis Date  . Depression    Axis IV: other psychosocial or environmental problems, problems related to social environment and problems with primary support group Axis V: 41-50 serious symptoms  ADL's:  Intact  Sleep: Good  Appetite:  Good  Suicidal Ideation:  Plan:  vague Intent:  none Means:  none Homicidal Ideation:  Denies  Psychiatric Specialty Exam: Review of Systems  Constitutional: Negative.   HENT: Negative.   Eyes: Negative.   Respiratory: Negative.   Cardiovascular: Negative.   Gastrointestinal: Negative.   Genitourinary: Negative.   Musculoskeletal: Negative.   Skin: Negative.   Neurological: Negative.   Endo/Heme/Allergies: Negative.   Psychiatric/Behavioral: Positive for depression and suicidal ideas.    Blood pressure 126/83, pulse 85, temperature 98 F (36.7 C), temperature source Oral, resp. rate 20, height 5\' 10"  (1.778 m), weight 94.348 kg (208 lb).Body mass index is 29.84 kg/(m^2).  General Appearance: Casual  Eye Contact::  Fair  Speech:  Normal Rate  Volume:  Normal  Mood:  Anxious and Depressed  Affect:  Congruent  Thought Process:  Coherent  Orientation:  Full (Time, Place, and Person)  Thought Content:  WDL  Suicidal Thoughts:  No  Homicidal Thoughts:  No  Memory:  Immediate;   Fair Recent;   Fair Remote;   Fair  Judgement:  Fair  Insight:  Fair  Psychomotor Activity:  Normal  Concentration:  Fair  Recall:  Fair  Akathisia:  No  Handed:  Right  AIMS (if  indicated):     Assets:  Physical Health Resilience Social Support  Sleep:  Number of Hours: 5.5   Current Medications: Current Facility-Administered Medications  Medication Dose Route Frequency Provider Last Rate Last Dose  . acetaminophen (TYLENOL) tablet 650 mg  650 mg Oral Q6H PRN Shuvon Rankin, NP      . alum & mag hydroxide-simeth (MAALOX/MYLANTA) 200-200-20 MG/5ML suspension 30 mL  30 mL Oral Q4H PRN Shuvon Rankin, NP   30 mL at 05/29/13 0945  . buPROPion (WELLBUTRIN XL) 24 hr tablet 300 mg  300 mg Oral Daily Nehemiah Settle, MD   300 mg at 05/29/13 0845  . hydrOXYzine (ATARAX/VISTARIL) tablet 50 mg  50 mg Oral QHS PRN Nehemiah Settle, MD      . magnesium hydroxide (MILK OF MAGNESIA) suspension 30 mL  30 mL Oral Daily PRN Shuvon Rankin, NP      . traZODone (DESYREL) tablet 50 mg  50 mg Oral QHS,MR X 1 Kerry Hough, PA-C   50 mg at 05/26/13 2126    Lab Results: No results found for this or any previous visit (from the past 48 hour(s)).  Physical Findings: AIMS: Facial and Oral Movements Muscles of Facial Expression: None, normal Lips and Perioral Area: None, normal Jaw: None, normal Tongue: None, normal,Extremity Movements Upper (arms, wrists, hands, fingers): None, normal Lower (legs, knees, ankles, toes): None, normal, Trunk Movements Neck, shoulders, hips: None, normal, Overall Severity Severity of abnormal movements (highest score from questions above): None, normal Incapacitation due to abnormal movements: None, normal  Patient's awareness of abnormal movements (rate only patient's report): No Awareness, Dental Status Current problems with teeth and/or dentures?: No Does patient usually wear dentures?: No  CIWA:  CIWA-Ar Total: 1 COWS:  COWS Total Score: 2  Treatment Plan Summary: Daily contact with patient to assess and evaluate symptoms and progress in treatment Medication management  Plan:  Review of chart, vital signs, medications, and  notes. 1-Individual and group therapy 2-Medication management for depression and anxiety:  Medications reviewed with the patient and he stated no untoward effects, complained of right hand pain--ibuprofen PRN ordered 3-Coping skills for depression, anxiety 4-Continue crisis stabilization and management 5-Address health issues--monitoring vital signs, stable 6-Treatment plan in progress to prevent relapse of depression and anxiety  Medical Decision Making Problem Points:  Established problem, stable/improving (1) and Review of psycho-social stressors (1) Data Points:  Review of medication regiment & side effects (2)  I certify that inpatient services furnished can reasonably be expected to improve the patient's condition.   Nanine Means, PMH-NP 05/29/2013, 2:57 PM  I agreed with the findings, treatment and disposition plan of this patient. Kathryne Sharper, MD

## 2013-05-29 NOTE — Progress Notes (Signed)
D) Pt rates his depression at a 2 and his hopelessness at a 1. Has attended the program and is interacting with select peers. In a 1:1 Pt talked about his dad and the progress he made with him prior to his death. States that his father accepted his orientation and even accepted his partner. Pt states that he has worked on himself and felt that he was working so very hard and not having any progress in his self esteem, that he became hopeless.  A) Given support, reassurance and praise. Encouraged to continue to journal his feelings while here. Praised for his ability and willingness to continue to chose his growth over stagnation. R) Pt denies SI and HI.

## 2013-05-29 NOTE — Progress Notes (Signed)
.  Psychoeducational Group Note    Date: 05/29/2013 Time:  0930  Goal Setting Purpose of Group: To be able to set a goal that is measurable and that can be accomplished in one day  Participation Level:  Active  Participation Quality:  Appropriate  Affect:  Appropriate  Cognitive:  Oriented  Insight:  Improving  Engagement in Group:  Improving  Additional Comments:    Ceejay Kegley A  

## 2013-05-30 DIAGNOSIS — F4310 Post-traumatic stress disorder, unspecified: Secondary | ICD-10-CM

## 2013-05-30 MED ORDER — HYDROCORTISONE 1 % EX CREA
TOPICAL_CREAM | Freq: Four times a day (QID) | CUTANEOUS | Status: DC
  Administered 2013-05-30 – 2013-05-31 (×3): via TOPICAL
  Filled 2013-05-30: qty 28

## 2013-05-30 NOTE — Progress Notes (Signed)
Psychoeducational Group Note  Date: 05/30/2013 Time: 0930  Group Topic/Focus:  Gratefulness:  The focus of this group is to help patients identify what two things they are most grateful for in their lives. What helps ground them and to center them on their work to their recovery.  Participation Level:  Active  Participation Quality: particiates  Affect:  Appropriate  Cognitive:  Oriented  Insight:  Improving  Engagement in Group:  Engaged  Additional Comments:   Camari Wisham A   

## 2013-05-30 NOTE — Progress Notes (Signed)
D) Pt has been attending the groups and interacting with his peers appropriately. Has insight and depth into his feelings and his emotions. States that he will continue to work on himself and his issues and hope to continue his growth. Denies SI and HI. A) Given support, reassurance and praise. Encouraged to continue to work on his issues and to continue his out patient therapy.  R) Denies SI and HI.

## 2013-05-30 NOTE — Progress Notes (Signed)
Adult Psychoeducational Group Note  Date:  05/30/2013 Time:  8:00pm Group Topic/Focus:  Wrap-Up Group:   The focus of this group is to help patients review their daily goal of treatment and discuss progress on daily workbooks.  Participation Level:  Active  Participation Quality:  Appropriate and Attentive  Affect:  Appropriate  Cognitive:  Alert and Appropriate  Insight: Appropriate  Engagement in Group:  Engaged  Modes of Intervention:  Discussion and Education  Additional Comments:   Pt attended and participated in group. Wrap up group discussion talked about 15 minute checks,enviromentals, and falls precautions. The question was asked How was your day? Pt stated it was rough at first form about 3-7 pm because of a higher dose of medication but eventually he began to feel better.  Shelly Bombard D 05/30/2013, 9:34 PM

## 2013-05-30 NOTE — Progress Notes (Signed)
Report received from C. Microbiologist. Writer entered patients room and observed him lying in bed asleep with eyes closed and respirations even and unlabored. No distress noted. Safety maintained on unit with 15 min checks, will continue to monitor.

## 2013-05-30 NOTE — Progress Notes (Signed)
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 Diagnosis:   DSM5: Posttraumatic Stress Disorder Substance/Addictive Disorders:  Alcohol Related Disorder - Severe (303.90) Depressive Disorders:  Major Depressive Disorder - Severe (296.23)  Axis I: Anxiety Disorder NOS, Major Depression, Recurrent severe and Post Traumatic Stress Disorder Axis II: Deferred Axis III:  Past Medical History  Diagnosis Date  . Depression    Axis IV: other psychosocial or environmental problems, problems related to social environment and problems with primary support group Axis V: 41-50 serious symptoms  ADL's:  Intact  Sleep: Good  Appetite:  Good  Suicidal Ideation:  Plan:  vague Intent:  none Means:  none Homicidal Ideation:  Denies   Psychiatric Specialty Exam: Review of Systems  Constitutional: Negative.   HENT: Negative.   Eyes: Negative.   Respiratory: Negative.   Cardiovascular: Negative.   Gastrointestinal: Negative.   Genitourinary: Negative.   Musculoskeletal: Negative.   Skin: Negative.   Neurological: Negative.   Endo/Heme/Allergies: Negative.   Psychiatric/Behavioral: Positive for depression and suicidal ideas. The patient is nervous/anxious.     Blood pressure 130/83, pulse 101, temperature 97.5 F (36.4 C), temperature source Oral, resp. rate 16, height 5\' 10"  (1.778 m), weight 94.348 kg (208 lb).Body mass index is 29.84 kg/(m^2).  General Appearance: Casual  Eye Contact::  Fair  Speech:  Normal Rate  Volume:  Normal  Mood:  Anxious and Depressed  Affect:  Congruent  Thought Process:  Coherent  Orientation:  Full (Time, Place, and Person)  Thought Content:  WDL  Suicidal  Thoughts:  Yes without intent  Homicidal Thoughts:  No  Memory:  Immediate;   Fair Recent;   Fair Remote;   Fair  Judgement:  Fair  Insight:  Fair  Psychomotor Activity:  Decreased  Concentration:  Fair  Recall:  Fair  Akathisia:  No  Handed:  Right  AIMS (if indicated):     Assets:  Leisure Time Physical Health Resilience Social Support  Sleep:  Number of Hours: 7.5   Current Medications: Current Facility-Administered Medications  Medication Dose Route Frequency Provider Last Rate Last Dose  . acetaminophen (TYLENOL) tablet 650 mg  650 mg Oral Q6H PRN Shuvon Rankin, NP      . alum & mag hydroxide-simeth (MAALOX/MYLANTA) 200-200-20 MG/5ML suspension 30 mL  30 mL Oral Q4H PRN Shuvon Rankin, NP   30 mL at 05/29/13 0945  . buPROPion (WELLBUTRIN XL) 24 hr tablet 300 mg  300 mg Oral Daily Nehemiah Settle, MD   300 mg at 05/30/13 0815  . hydrOXYzine (ATARAX/VISTARIL) tablet 50 mg  50 mg Oral QHS PRN Nehemiah Settle, MD   50 mg at 05/29/13 2233  . ibuprofen (ADVIL,MOTRIN) tablet 600 mg  600 mg Oral Q6H PRN Nanine Means, NP   600 mg at 05/30/13 0817  . magnesium hydroxide (MILK OF MAGNESIA) suspension 30 mL  30 mL Oral Daily PRN Shuvon Rankin, NP      . traZODone (DESYREL) tablet 50 mg  50 mg Oral QHS,MR X 1 Kerry Hough, PA-C   50 mg at 05/26/13 2126    Lab Results: No results found for this or any previous visit (from the past 48 hour(s)).  Physical Findings:  AIMS: Facial and Oral Movements Muscles of Facial Expression: None, normal Lips and Perioral Area: None, normal Jaw: None, normal Tongue: None, normal,Extremity Movements Upper (arms, wrists, hands, fingers): None, normal Lower (legs, knees, ankles, toes): None, normal, Trunk Movements Neck, shoulders, hips: None, normal, Overall Severity Severity of abnormal movements (highest score from questions above): None, normal Incapacitation due to abnormal movements: None, normal Patient's awareness of  abnormal movements (rate only patient's report): No Awareness, Dental Status Current problems with teeth and/or dentures?: No Does patient usually wear dentures?: No  CIWA:  CIWA-Ar Total: 1 COWS:  COWS Total Score: 2  Treatment Plan Summary: Daily contact with patient to assess and evaluate symptoms and progress in treatment Medication management  Plan:  Review of chart, vital signs, medications, and notes. 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   Nanine Means, PMH-NP 05/30/2013, 8:59 AM  I agreed with the findings, treatment and disposition plan of this patient. Kathryne Sharper, MD

## 2013-05-30 NOTE — Progress Notes (Signed)
Psychoeducational Group Note  Date:  05/30/2013 Time:  1015  Group Topic/Focus:  Making Healthy Choices:   The focus of this group is to help patients identify negative/unhealthy choices they were using prior to admission and identify positive/healthier coping strategies to replace them upon discharge.  Participation Level:  Active  Participation Quality:  Appropriate  Affect:  Anxious and Appropriate  Cognitive:  Oriented  Insight:  Improving  Engagement in Group:  Engaged  Additional Comments:    Gopal Malter A 05/30/2013 

## 2013-05-30 NOTE — Progress Notes (Signed)
Writer entered patients room and observed him sitting on his bed reading. Patient has been up in the dayroom interacting with peers earlier. Patient reported in group that he has found that the groups have been most helpful. Support and encouragement offered, patient reports that the sleep medication he took on last night made him feel very tired the following day. Writer informed him that he has visteril available if needing. Safety  maintained on unit with 15 min checks. Will continue to monitor.

## 2013-05-30 NOTE — [facility] Group Notes (Signed)
[facility] Group Notes:  (Clinical Social Work)  05/30/2013   1:15-2:20PM  Summary of Progress/Problems:   The main focus of today's process group was to   identify the patient's current support system and decide on other supports that can be put in place.  The picture on workbook was used to discuss why additional supports are needed, then used to talk about how patients have given and received all different kinds of support.  An emphasis was placed on using counselor, doctor, therapy groups, 12-step groups, and problem-specific support groups to expand supports.  The patient expressed full comprehension of the concepts presented, and agreed that there is a need to add more supports.  The patient stated the current supports in place are his partner, his sponsor, the AA community in [place], his new doctor at [place], and a new therapist he is going to go to.  He had multiple insightful suggestions for group members, particularly with regard to accountability.  Type of Therapy:  Process Group  Participation Level:  Active  Participation Quality:  Attentive, Sharing and Supportive  Affect:  Blunted and Depressed  Cognitive:  Appropriate and Oriented  Insight:  Engaged  Engagement in Therapy:  Engaged  Modes of Intervention:  Education,  Support and [employer], LCSW 05/30/2013, 3:13 PM

## 2013-05-31 MED ORDER — BUPROPION HCL ER (XL) 300 MG PO TB24: 300.0000 mg | ORAL_TABLET | Freq: Every day | ORAL | Status: DC

## 2013-05-31 MED ORDER — HYDROXYZINE HCL 50 MG PO TABS: 50.0000 mg | ORAL_TABLET | Freq: Every evening | ORAL | Status: DC | PRN

## 2013-05-31 MED ORDER — TRAZODONE HCL 50 MG PO TABS: ORAL_TABLET | ORAL | Status: DC

## 2013-05-31 NOTE — Progress Notes (Signed)
[facility] Group Notes:  (Nursing/MHT/Case Management/Adjunct)  Date:  05/31/2013  Time:  3:36 PM  Type of Therapy:  Psychoeducational Skills  Participation Level:  Active  Participation Quality:  Appropriate  Affect:  Appropriate  Cognitive:  Appropriate  Insight:  Appropriate  Engagement in Group:  Engaged and Supportive  Modes of Intervention:  Activity  Summary of Progress/Problems:  Clarice Bonaventure C 05/31/2013, 3:36 PM 

## 2013-05-31 NOTE — Progress Notes (Signed)
[facility] Adult Case Management Discharge Plan :  Will you be returning to the same living situation after discharge: Yes,  Patient is returning to his home. At discharge, do you have transportation home?:Yes,  Patient to arrange transportation home. Do you have the ability to pay for your medications: No, patient will be assisted with indigent medications.  Release of information consent forms completed and in the chart;  Patient's signature needed at discharge.  Patient to Follow up at:  You are scheduled with [facility] on Tuesday, June 01, 2013 at 11:00 AM Follow-up Information   Follow up with     [facility]. (You are scheduled with )    Contact information:   [address] [place], [place]   [address]   [phone]      Follow up with [facility]. (Please go to [facility] walk in clinic on Wednesday, June 02, 2013 or any weekday between 8 AM-3 PM)    Contact information:   [address] [place], [place]   [address]  [phone]      Patient denies SI/HI:   Patient no longer endorsing SI/HI or other thoughts of self harm.     Safety Planning and Suicide Prevention discussed:  .Reviewed with all patients during discharge planning group   Kell Ferris, [place] July 05/31/2013, 10:45 AM

## 2013-05-31 NOTE — Progress Notes (Signed)
Pt attended spiritual care group on grief and loss facilitated by chaplain Marc Holloway.  Group opened with brief discussion and psycho-social ed around grief and loss in relationships and in relation to self - identifying life patterns, circumstances, changes that cause losses. Established group norm of speaking from own life experience. Group goal of establishing open and affirming space for members to share loss and experience with grief, normalize grief experience and provide psycho social education and grief support.  Marc Holloway was present and attentive throughout group.  He did not contribute to group discussion.   [name] MDiv

## 2013-05-31 NOTE — Progress Notes (Signed)
Discharge Note:  Patient discharged home with partner.  Denied SI and HI.  Denied A/V hallucinations.  Denied pain.  Suicide prevention information given and discussed with patient, who stated he understood and had no questions.  Patient received all his belongings, belt, wallet with ID's and cards, miscellaneous items, toiletries, clothing, prescriptions, med.  Patient stated he appreciated all assistance received from [facility] staff.

## 2013-05-31 NOTE — Progress Notes (Addendum)
D:  Patient's self inventory sheet, patient sleeps well, good appetite, normal energy level, improving attention span.  Rated depression and hopeless 3.  Denied withdrawals.  Denied SI.   Has experienced shooting pain in left hand/arm.  Worst pain #3.  After discharge, plans to get insurance, go to [place], work less, evict stressful people, more meetings and leisure activities outside house.  No discharge plans.  No problems taking meds after discharge. A:  Medications administered per MD orders.  Emotional support and encouragement given patient. R:  Denied SI and HI.  Denied A/V hallucinations.  Will continue to monitor patient for safety with 15 minute checks. Safety maintained. Patient plans to follow up with [facility] after discharge.  Has had 10 year sobriety.  Will try to get insurance after discharge.

## 2013-05-31 NOTE — [facility] Suicide Risk Assessment (Signed)
Suicide Risk Assessment  Discharge Assessment     Demographic Factors:  Male, Adolescent or young adult, Caucasian and Low socioeconomic status  Mental Status Per Nursing Assessment::   On Admission:  NA  Current Mental Status by Physician: Mental Status Examination: Patient appeared as per his stated age, casually dressed, and fairly groomed, and maintaining good eye contact. Patient has good mood and his affect was constricted. He has normal rate, rhythm, and volume of speech. His thought process is linear and goal directed. Patient has denied suicidal, homicidal ideations, intentions or plans. Patient has no evidence of auditory or visual hallucinations, delusions, and paranoia. Patient has fair insight judgment and impulse control.  Loss Factors: Financial problems/change in socioeconomic status  Historical Factors: Impulsivity  Risk Reduction Factors:   Sense of responsibility to family, Religious beliefs about death, Living with another person, especially a relative, Positive social support, Positive therapeutic relationship and Positive coping skills or problem solving skills  Continued Clinical Symptoms:  Depression:   Recent sense of peace/wellbeing Unstable or Poor Therapeutic Relationship Previous Psychiatric Diagnoses and Treatments  Cognitive Features That Contribute To Risk:  Closed-mindedness    Suicide Risk:  Minimal: No identifiable suicidal ideation.  Patients presenting with no risk factors but with morbid ruminations; may be classified as minimal risk based on the severity of the depressive symptoms  Discharge Diagnoses:   AXIS I:  Major Depression, Recurrent severe AXIS II:  Deferred AXIS III:   Past Medical History  Diagnosis Date  . Depression    AXIS IV:  occupational problems, other psychosocial or environmental problems, problems related to social environment, problems with access to health care services and problems with primary support group AXIS  V:  61-70 mild symptoms  Plan Of Care/Follow-up recommendations:  Activity:  As tolerated Diet:  Regular  Is patient on multiple antipsychotic therapies at discharge:  No   Has Patient had three or more failed trials of antipsychotic monotherapy by history:  No  Recommended Plan for Multiple Antipsychotic Therapies: NA  Nehemiah Settle., M.D. 05/31/2013, 11:43 AM

## 2013-05-31 NOTE — Progress Notes (Signed)
Adult Psychoeducational Group Note  Date:  05/31/2013 Time:  11:00am  Group Topic/Focus:  Self Care:   The focus of this group is to help patients understand the importance of self-care in order to improve or restore emotional, physical, spiritual, interpersonal, and financial health.  Participation Level:  Active  Participation Quality:  Appropriate and Attentive  Affect:  Appropriate  Cognitive:  Alert and Appropriate  Insight: Appropriate  Engagement in Group:  Engaged  Modes of Intervention:  Discussion and Education  Additional Comments:  Pt attended and participated in group. Discussion was on wellness and self care. Pt. Talked about the one thing that made them angry and how they deal with it and what is a more positive way of dealing with their anger. Pt also stated one thing that makes them happy and how they can think of that when they begin to feel anxiety or depression coming on.   Shelly Bombard D 05/31/2013, 1:28 PM

## 2013-05-31 NOTE — Tx Team (Signed)
Interdisciplinary Treatment Plan Update   Date Reviewed:  05/31/2013  Time Reviewed:  9:59 AM  Progress in Treatment:   Attending groups: Yes Participating in groups: Yes Taking medication as prescribed: Yes, contact made with partner. Tolerating medication: Yes Family/Significant other contact made: Yes  Patient understands diagnosis: Yes  Discussing patient identified problems/goals with staff: Yes Medical problems stabilized or resolved: Yes Denies suicidal/homicidal ideation: Yes Patient has not harmed self or others: Yes  For review of initial/current patient goals, please see plan of care.  Estimated Length of Stay: Discharge today  Reasons for Continued Hospitalization:   New Problems/Goals identified:    Discharge Plan or Barriers:   Home with outpatient follow up with [facility]  Additional Comments:  N/A  Attendees:  Patient:  Marc Holloway 05/31/2013 9:59 AM   Signature: Mervyn Gay, MD 05/31/2013 9:59 AM  Signature:  Verne Spurr, PA 05/31/2013 9:59 AM  Signature: Roswell Miners, RN  05/31/2013 9:59 AM  Signature: Quintella Reichert, RN 05/31/2013 9:59 AM  Signature:  Neill Loft, RN 05/31/2013 9:59 AM  Signature:  Juline Patch, LCSW 05/31/2013 9:59 AM  Signature:  Tomasita Morrow, Care Coordinator 05/31/2013 9:59 AM  Signature:  05/31/2013 9:59 AM  Signature:   05/31/2013 9:59 AM  Signature:  05/31/2013  9:59 AM  Signature:    05/31/2013  9:59 AM  Signature:   05/31/2013  9:59 AM    Scribe for Treatment Team:   Juline Patch,  05/31/2013 9:59 AM

## 2013-05-31 NOTE — [facility] Group Notes (Signed)
[facility] LCSW Aftercare Discharge Planning Group Note   05/31/2013 10:48 AM    Participation Quality:  Appropraite  Mood/Affect:  Appropriate  Depression Rating:  3  Anxiety Rating:  4  Thoughts of Suicide:  No  Will you contract for safety?   NA  Current AVH:  No  Plan for Discharge/Comments:  Patient attending discharge planning group and actively participated in group.  He reports doing much better and ready to discharge home today.  He will follow up with [facility].  CSW provided all participants with daily workbook.   Transportation Means: Patient has transportation.   Supports:  Patient has a support system.   Romualdo Prosise, Joesph July

## 2013-05-31 NOTE — Discharge Summary (Signed)
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  Depression     AXIS IV: occupational problems and problems related to social environment  AXIS V: 41-50 serious symptoms   Level of Care:  OP  Hospital Course: Marc Holloway is a 42 year old white male who presented to the [facility] at the request of his partner who found him searching on the internet for ways to commit suicide. Marc Holloway endorses a "lifetime" of depression but states his symptoms have worsened over the past several months to include feeling helpless, with racing thoughts, crying, inability to make a decision, increased fatigue with no motivation, increased anhedonia, mood swings with irritability and being over whelmed with what others think of him. He was felt to be in need of acute psychiatric hospitalization for crisis management and stabilization.      On admission to the adult unit Marc Holloway was evaluated and his symptoms were identified. His current medication regimen was reviewed and a treatment plan was implemented with Marc Holloway given options for his course of treatment. He elected to increase the wellbutrin and then to add Abilify if this did not improve his symptoms.       He was  also encouraged to participate in unit programming for problem solving skills, stress reduction, and relaxation therapy in a supportive therapeutic milieu.  He was active programming and was supportive and appropriate in his behaviors toward other patients and was very cooperative and polite with the staff.  Marc Holloway did not need a 1:1 during his admission and he reported no side effects to the increase in his medication.      By the day of discharge he was in much improved condition than upon arrival and noted that most of his symptoms had resolved or decreased significantly. He was felt stable to be discharged home with the plans to follow up as noted below.  Consults:  None  Significant Diagnostic Studies:  labs: CBC, CMP, UA. UDS  Discharge Vitals:   Blood pressure 124/89, pulse 88, temperature 97.4 F (36.3 C), temperature source Oral, resp. rate 16, height 5\' 10"  (1.778 m), weight 94.348 kg (208 lb). Body mass index is 29.84 kg/(m^2). Lab Results:   No results found for this or any previous visit (from the past 72 hour(s)).  Physical Findings: AIMS: Facial and Oral Movements Muscles of Facial Expression: None, normal Lips and Perioral Area: None, normal Jaw: None, normal Tongue: None, normal,Extremity Movements Upper (arms, wrists, hands, fingers): None, normal Lower (legs, knees, ankles, toes): None, normal, Trunk Movements Neck, shoulders, hips: None, normal, Overall Severity Severity of abnormal movements (highest score from questions above): None, normal Incapacitation due to abnormal movements: None, normal Patient's awareness of abnormal movements (rate only patient's report): No Awareness, Dental Status Current problems with teeth and/or dentures?: No Does  patient usually wear dentures?: No  CIWA:  CIWA-Ar Total: 1 COWS:  COWS Total Score: 2  Psychiatric Specialty Exam: See Psychiatric Specialty Exam and Suicide Risk Assessment completed by Attending Physician prior to  discharge.  Discharge destination:  Home  Is patient on multiple antipsychotic therapies at discharge:  No   Has Patient had three or more failed trials of antipsychotic monotherapy by history:  No  Recommended Plan for Multiple Antipsychotic Therapies: NA  Discharge Orders   Future Orders Complete By Expires   Diet - low sodium heart healthy  As directed    Discharge instructions  As directed    Comments:     Take all of your medications as directed. Be sure to keep all of your follow up appointments.  If you are unable to keep your follow up appointment, call your Doctor's office to let them know, and reschedule.  Make sure that you have enough medication to last until your appointment. Be sure to get plenty of rest. Going to bed at the same time each night will help. Try to avoid sleeping during the day.  Increase your activity as tolerated. Regular exercise will help you to sleep better and improve your mental health. Eating a heart healthy diet is recommended. Try to avoid salty or fried foods. Be sure to avoid all alcohol and illegal drugs.   Increase activity slowly  As directed        Medication List    STOP taking these medications       buPROPion 150 MG 12 hr tablet  Commonly known as:  WELLBUTRIN SR  Replaced by:  buPROPion 300 MG 24 hr tablet      TAKE these medications     Indication   buPROPion 300 MG 24 hr tablet  Commonly known as:  WELLBUTRIN XL  Take 1 tablet (300 mg total) by mouth daily. For anxiety and depression.   Indication:  Major Depressive Disorder     naproxen 250 MG tablet  Commonly known as:  NAPROSYN  Take 375 mg by mouth 2 (two) times daily as needed (pain).      traZODone 50 MG tablet  Commonly known as:  DESYREL  Take one tablet as needed for insomnia at bedtime.   Indication:  Trouble Sleeping           Follow-up Information   Follow up with     [facility]. (You are scheduled with )    Contact information:   [address] [place], [place]   [address]   [phone]      Follow up with [facility]. (Please go to [facility] walk in clinic on Wednesday, June 02, 2013 or any weekday between 8 AM-3 PM)    Contact information:   [address] [place], [place]   [address]  [phone]      Follow up with [facility] On 06/01/2013. (You are scheduled with Tracey on Tuesday, Novemger 4, 2014 at 11 AM)    Contact information:   [address] [place], [place]   [address]  [phone]      Follow-up recommendations:   Activities: Resume activity as tolerated. Diet: Heart healthy low sodium diet Tests: Follow up testing will be determined by your out patient provider. Comments:   Total Discharge Time:  Less than 30 minutes.  Rona Ravens. [place] RPAC 11:17 PM 06/02/2013  Patient was seen face-to-face for this psychiatric evaluation, suicide risk assessment, this is pursued  the treatment team and physician extender. Made Treatment status discharged plans.Reviewed the information documented and agree with the treatment plan.  Kynnedi Zweig,JANARDHAHA R. 06/04/2013 9:12 AM

## 2013-06-02 NOTE — Progress Notes (Addendum)
Patient Discharge Instructions:  After Visit Summary (AVS):   Faxed to:  06/02/13 Discharge Summary Note:   Faxed to:  06/03/13 Psychiatric Admission Assessment Note:   Faxed to:  06/02/13 Suicide Risk Assessment - Discharge Assessment:   Faxed to:  06/02/13 Faxed/Sent to the Next Level Care provider:  06/02/13 Faxed to [facility] @ [phone] Faxed to [facility] @ [phone]  Jerelene Redden, 06/02/2013, 10:03 AM

## 2019-10-03 ENCOUNTER — Ambulatory Visit: Payer: Self-pay

## 2019-10-04 ENCOUNTER — Ambulatory Visit: Payer: Self-pay

## 2019-10-04 ENCOUNTER — Ambulatory Visit: Payer: Self-pay | Attending: Internal Medicine

## 2019-10-04 DIAGNOSIS — Z23 Encounter for immunization: Secondary | ICD-10-CM | POA: Insufficient documentation

## 2019-10-04 NOTE — Progress Notes (Signed)
   Covid-19 Vaccination Clinic  Name:  Menno Vanbergen    MRN: [id number] DOB: 1970/10/08  10/04/2019  Mr. Buddenhagen was observed post Covid-19 immunization for 15 minutes without incident. He was provided with Vaccine Information Sheet and instruction to access the V-Safe system.   Mr. Marrow was instructed to call 911 with any severe reactions post vaccine: Marland Kitchen Difficulty breathing  . Swelling of face and throat  . A fast heartbeat  . A bad rash all over body  . Dizziness and weakness   Immunizations Administered    Name Date Dose VIS Date Route   Pfizer COVID-19 Vaccine 10/04/2019 11:28 AM 0.3 mL 07/09/2019 Intramuscular   Manufacturer: [employer], [employer]   Lot: [id number]   NDC: [address]-4

## 2019-11-03 ENCOUNTER — Ambulatory Visit: Payer: Self-pay | Attending: Internal Medicine

## 2019-11-03 DIAGNOSIS — Z23 Encounter for immunization: Secondary | ICD-10-CM

## 2019-11-03 NOTE — Progress Notes (Signed)
   Covid-19 Vaccination Clinic  Name:  Marc Holloway    MRN: [id number] DOB: 07-Apr-1971  11/03/2019  Mr. Marc Holloway was observed post Covid-19 immunization for 15 minutes without incident. He was provided with Vaccine Information Sheet and instruction to access the V-Safe system.   Mr. Marc Holloway was instructed to call 911 with any severe reactions post vaccine: Marland Kitchen Difficulty breathing  . Swelling of face and throat  . A fast heartbeat  . A bad rash all over body  . Dizziness and weakness   Immunizations Administered    Name Date Dose VIS Date Route   Pfizer COVID-19 Vaccine 11/03/2019 11:33 AM 0.03 mL 07/09/2019 Intramuscular   Manufacturer: [employer], [employer]   Lot: [id number]   NDC: [address]-4

## 2020-12-11 ENCOUNTER — Ambulatory Visit: Payer: Self-pay

## 2020-12-11 NOTE — Progress Notes (Signed)
   Covid-19 Vaccination Clinic  Name:  Marc Holloway    MRN: [id number] DOB: 31-May-1971  12/11/2020  [name] was observed post Covid-19 immunization for 15 minutes without incident. He was provided with Vaccine Information Sheet and instruction to access the V-Safe system.   [name] was instructed to call 911 with any severe reactions post vaccine: Marland Kitchen Difficulty breathing  . Swelling of face and throat  . A fast heartbeat  . A bad rash all over body  . Dizziness and weakness

## 2020-12-12 ENCOUNTER — Other Ambulatory Visit (HOSPITAL_BASED_OUTPATIENT_CLINIC_OR_DEPARTMENT_OTHER): Payer: Self-pay

## 2020-12-12 MED ORDER — PFIZER-BIONT COVID-19 VAC-TRIS 30 MCG/0.3ML IM SUSP
INTRAMUSCULAR | 0 refills | Status: AC
  Filled 2020-12-12: qty 0.3, 1d supply, fill #0

## 2021-03-19 ENCOUNTER — Other Ambulatory Visit (HOSPITAL_BASED_OUTPATIENT_CLINIC_OR_DEPARTMENT_OTHER): Payer: Self-pay

## 2021-07-04 ENCOUNTER — Other Ambulatory Visit: Payer: Self-pay | Admitting: Family Medicine

## 2021-07-04 DIAGNOSIS — R109 Unspecified abdominal pain: Secondary | ICD-10-CM

## 2021-07-25 ENCOUNTER — Other Ambulatory Visit: Payer: Self-pay

## 2021-08-20 ENCOUNTER — Ambulatory Visit
Admission: RE | Admit: 2021-08-20 | Discharge: 2021-08-20 | Disposition: A | Discharge status: Home / Self Care | Payer: Commercial Managed Care - PPO | Source: Ambulatory Visit | Attending: Family Medicine | Admitting: Family Medicine

## 2021-08-20 DIAGNOSIS — R109 Unspecified abdominal pain: Secondary | ICD-10-CM

## 2022-01-07 ENCOUNTER — Encounter: Payer: Self-pay | Admitting: Plastic Surgery

## 2022-01-07 ENCOUNTER — Ambulatory Visit: Payer: Self-pay | Admitting: Plastic Surgery

## 2022-01-07 VITALS — BP 112/69 | HR 64 | Ht 71.0 in | Wt 205.0 lb

## 2022-01-07 DIAGNOSIS — Z411 Encounter for cosmetic surgery: Secondary | ICD-10-CM

## 2022-01-07 NOTE — Progress Notes (Signed)
   Referring Provider No referring provider defined for this encounter.   CC:  Desire for liposuction abdominal flanks   Marc Holloway is an 51 y.o. male.  HPI: 51 year old who is interested in liposuction of abdomen and abdominal flanks.  He had no previous liposuction.  He has had weight loss since October intentionally and has been working out the gym.  He is currently at a BMI of 28.    No Known Allergies  Outpatient Encounter Medications as of 01/07/2022  Medication Sig   acyclovir (ZOVIRAX) 800 MG tablet SMARTSIG:1 Tablet(s) By Mouth   alfuzosin (UROXATRAL) 10 MG 24 hr tablet Take 10 mg by mouth daily.   ARMOUR THYROID 180 MG tablet Take 180 mg by mouth daily.   atorvastatin (LIPITOR) 10 MG tablet Take 10 mg by mouth daily.   COVID-19 mRNA Vac-TriS, Pfizer, (PFIZER-BIONT COVID-19 VAC-TRIS) SUSP injection Inject into the muscle.   emtricitabine-tenofovir (TRUVADA) 200-300 MG tablet Take 1 tablet by mouth daily.   tadalafil (CIALIS) 5 MG tablet Take 5 mg by mouth daily.   testosterone cypionate (DEPOTESTOSTERONE CYPIONATE) 200 MG/ML injection SMARTSIG:0.1 Milliliter(s) IM Daily   [DISCONTINUED] buPROPion (WELLBUTRIN XL) 300 MG 24 hr tablet Take 1 tablet (300 mg total) by mouth daily. For anxiety and depression. (Patient not taking: Reported on 01/07/2022)   [DISCONTINUED] hydrOXYzine (ATARAX/VISTARIL) 50 MG tablet Take 1 tablet (50 mg total) by mouth at bedtime as needed (for sleep.). (Patient not taking: Reported on 01/07/2022)   [DISCONTINUED] naproxen (NAPROSYN) 250 MG tablet Take 375 mg by mouth 2 (two) times daily as needed (pain). (Patient not taking: Reported on 01/07/2022)   No facility-administered encounter medications on file as of 01/07/2022.     Past Medical History:  Diagnosis Date   Depression     No past surgical history on file.  No family history on file.  Social History   Social History Narrative   Not on file     Review of Systems General: Denies  fevers, chills, weight loss CV: Denies chest pain, shortness of breath, palpitations   Physical Exam    01/07/2022    1:17 PM 05/31/2013    9:44 AM 05/31/2013    7:58 AM  Vitals with BMI  Height 5\' 11"     Weight 205 lbs    BMI 28.6    Systolic 112    Diastolic 69    Pulse 64       Information is confidential and restricted. Go to Review Flowsheets to unlock data.    General:  No acute distress,  Alert and oriented, Non-Toxic, Normal speech and affect Abdomen: Mild amount of excess adipose anteriorly in the abdomen.  He also has some adipose in the anterior and posterior abdominal flanks.  Assessment/Plan Patient is a good candidate for liposuction of abdomen and abdominal flanks, anterior and posterior with a flip in the operating room for access to the posterior region.  I explained that this would give the best result for him even though this would take extra time for positioning. 01/07/2022, 1:55 PM

## 2022-01-08 ENCOUNTER — Encounter: Payer: Self-pay | Admitting: *Deleted

## 2022-05-21 IMAGING — US US ABDOMEN COMPLETE
1 series · 14 of 25 positions shown · non-contrast
Comparison: None.

CLINICAL DATA: Unspecified abdominal pain

EXAM:
ABDOMEN ULTRASOUND COMPLETE

[Series 1: us abdomen complete · 0.23mm/px · 14 of 74 slices shown]
[im 1/74]
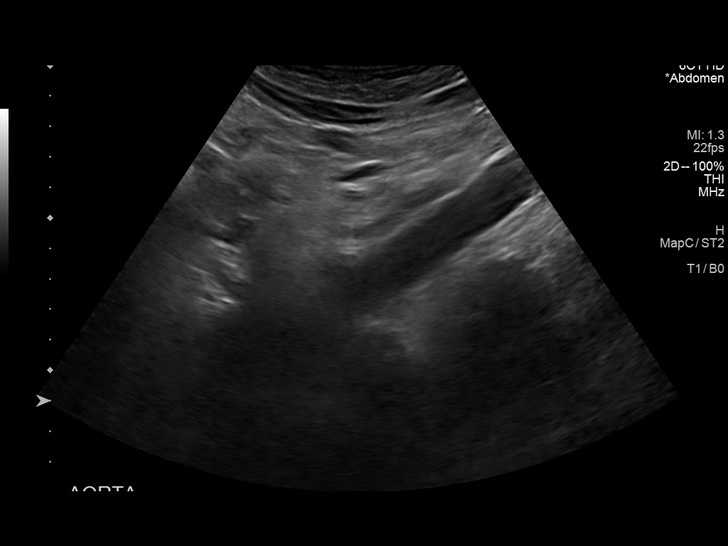
[im 7/74]
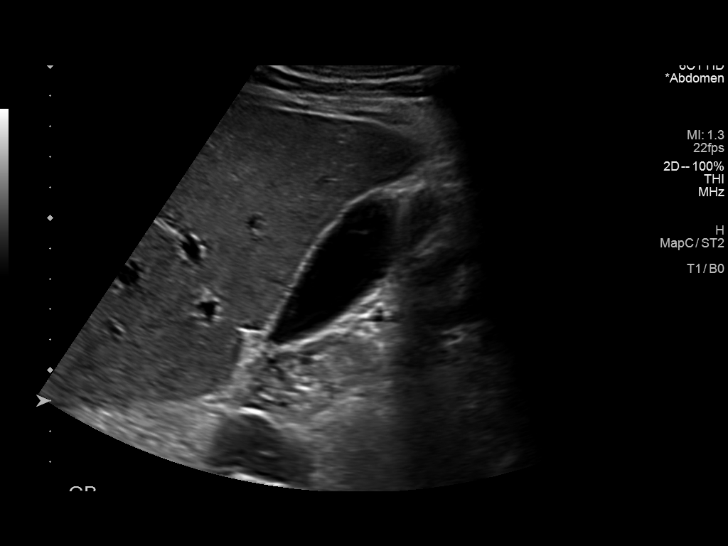
[im 13/74]
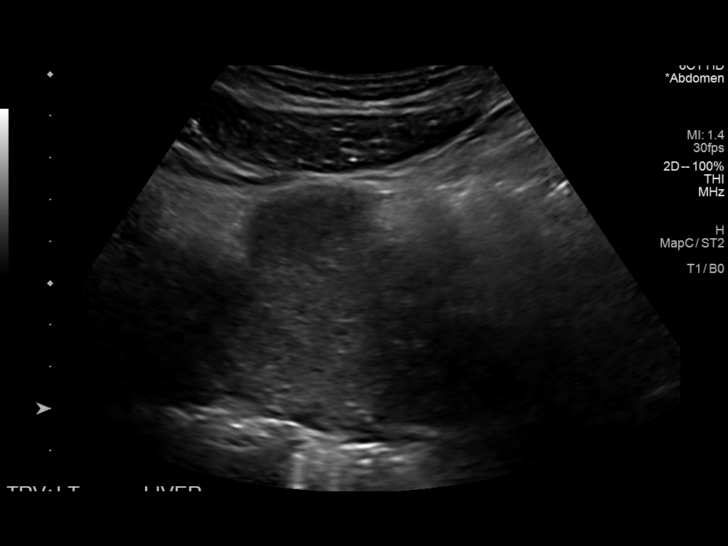
[im 19/74]
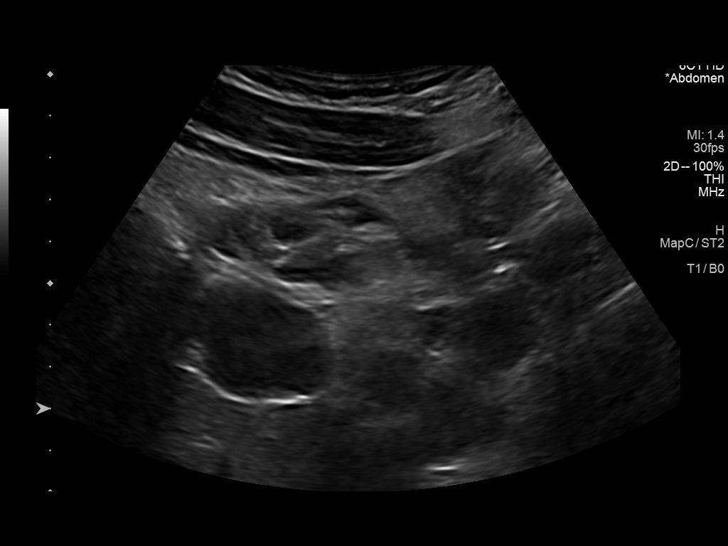
[im 25/74]
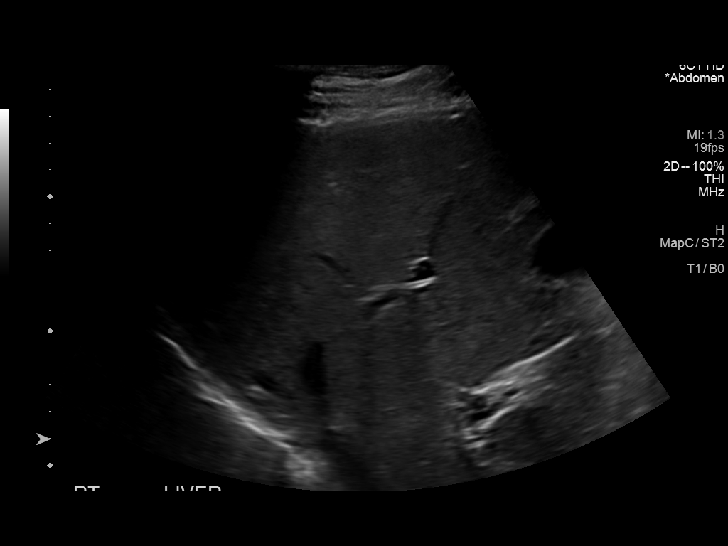
[im 28/74]
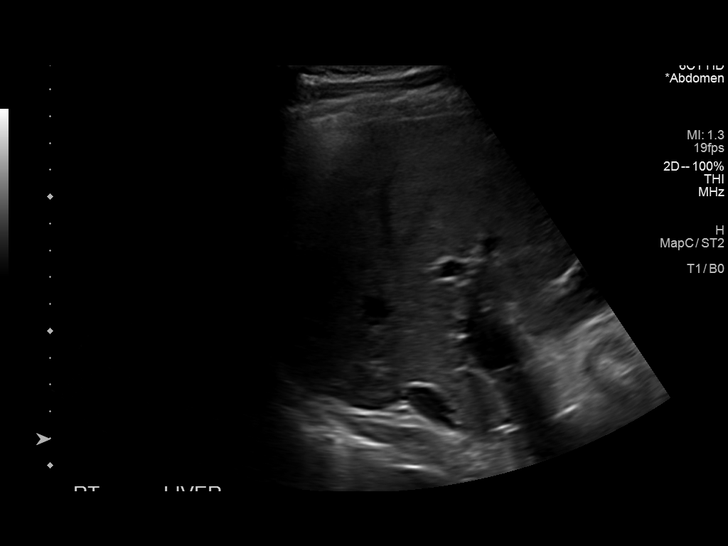
[im 34/74]
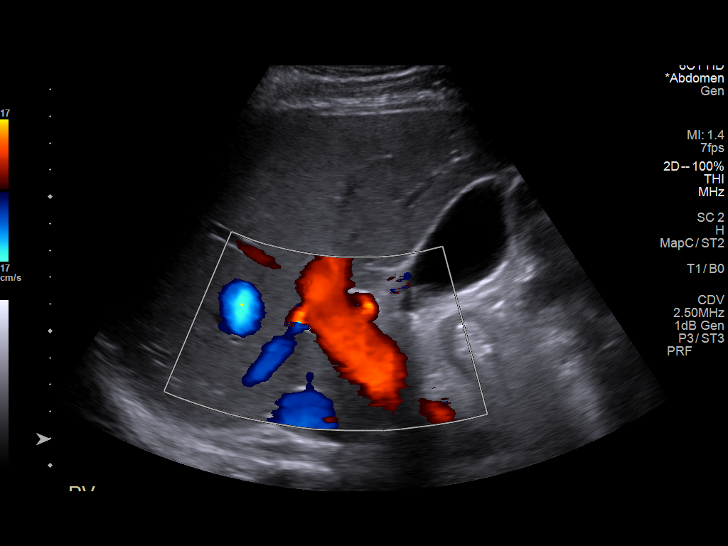
[im 40/74]
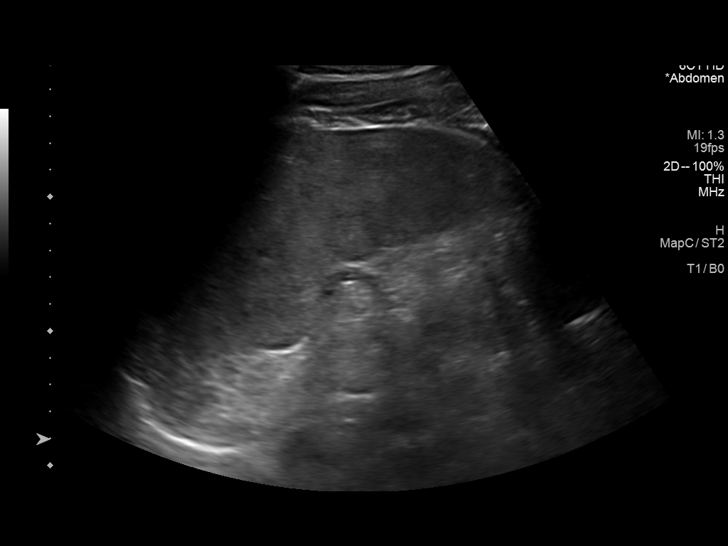
[im 46/74]
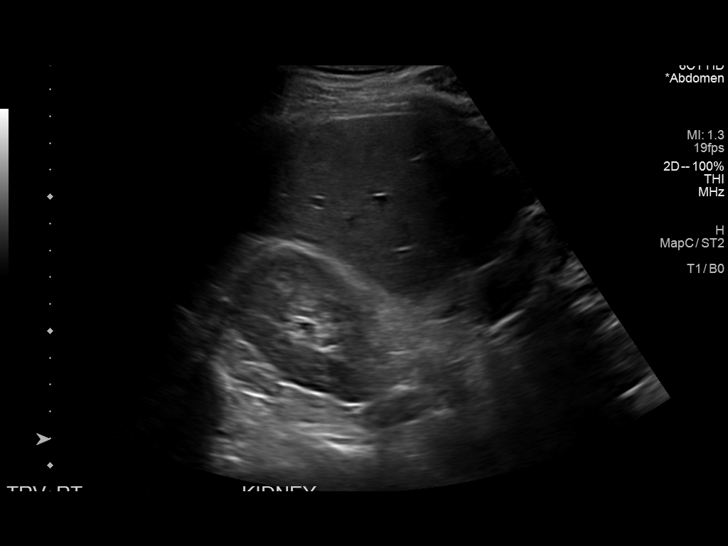
[im 49/74]
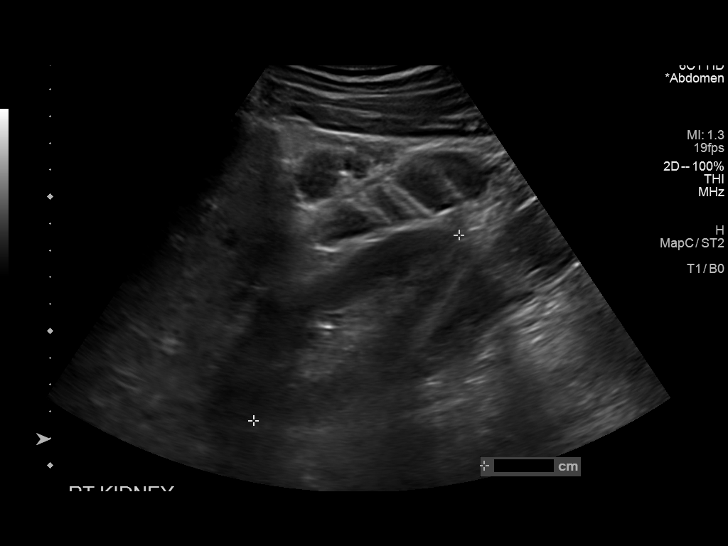
[im 55/74]
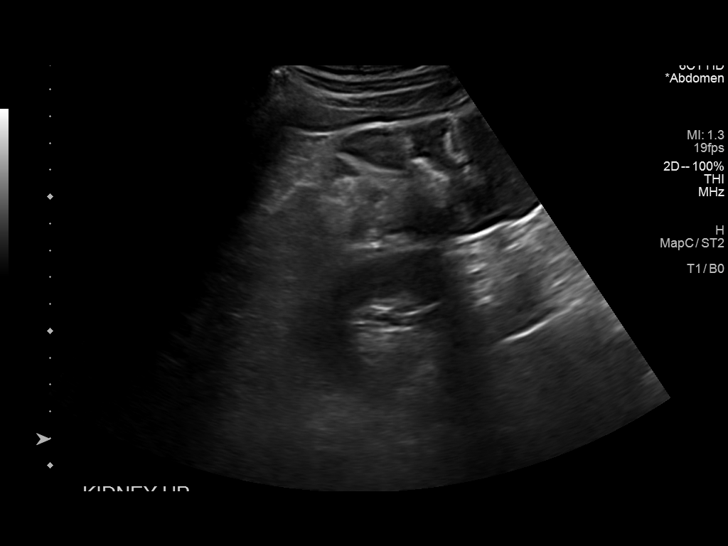
[im 61/74]
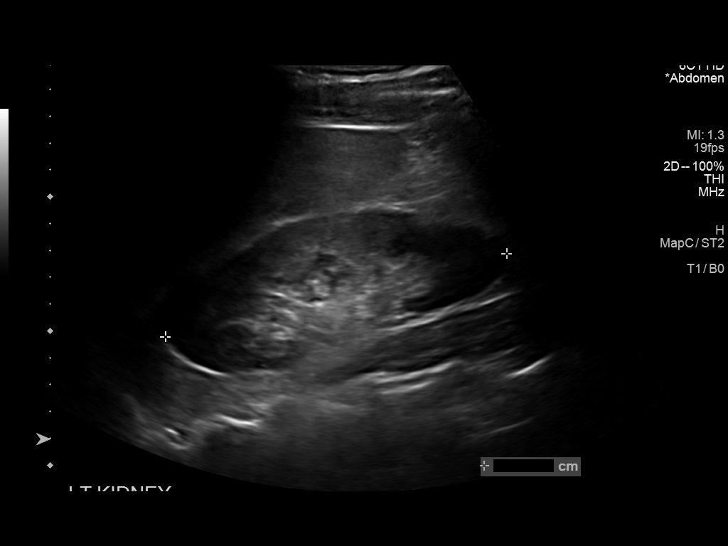
[im 67/74]
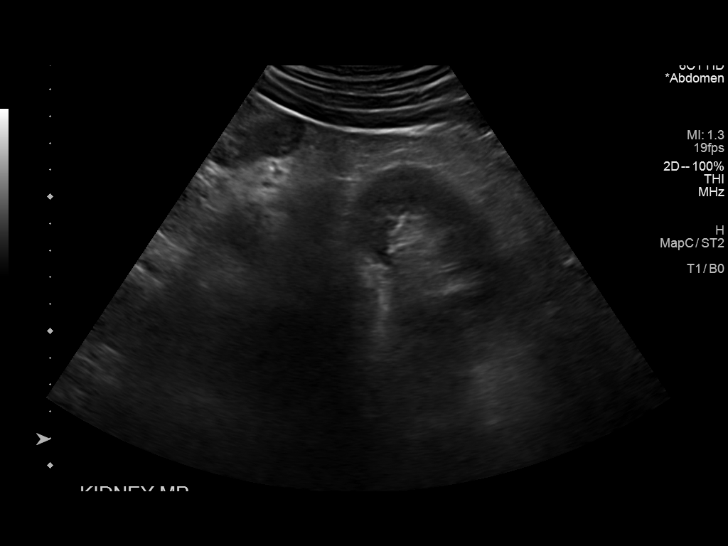
[im 74/74]
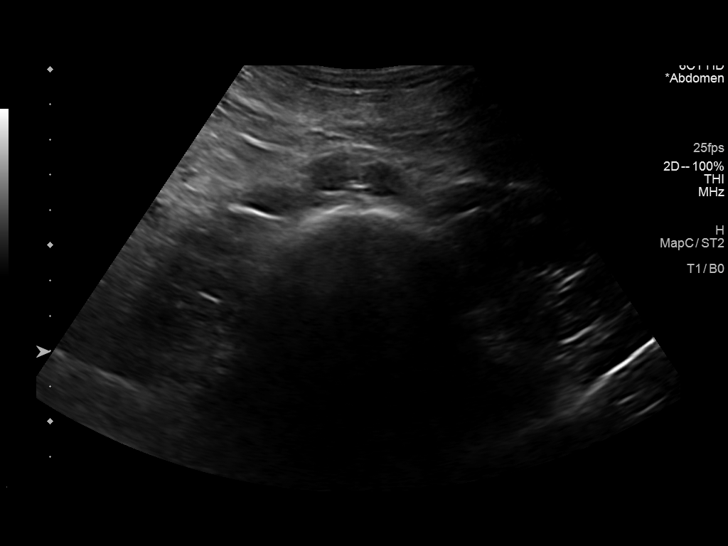

[14 of 25 positions shown; findings below may reference images not displayed]

FINDINGS: Gallbladder: No gallstones or wall thickening visualized. No
sonographic Murphy sign noted by sonographer.

Common bile duct: Diameter: 3 mm in proximal diameter

Liver: No focal lesion identified. Within normal limits in
parenchymal echogenicity. Portal vein is patent on color Doppler
imaging with normal direction of blood flow towards the liver.

IVC: No abnormality visualized.

Pancreas: Visualized portion unremarkable.

Spleen: The spleen is mildly enlarged measuring up to 13.9 cm in
greatest dimension. No intrasplenic lesions are seen. No subcapsular
fluid collections are identified.

Right Kidney: Length: 10.3 cm. Echogenicity within normal limits. No
mass or hydronephrosis visualized.

Left Kidney: Length: 13.1 cm. Echogenicity within normal limits. No
mass or hydronephrosis visualized.

Abdominal aorta: No aneurysm visualized.

Other findings: None.
IMPRESSION: Mild splenomegaly.

## 2022-10-22 ENCOUNTER — Other Ambulatory Visit: Payer: Self-pay

## 2022-10-22 ENCOUNTER — Encounter (HOSPITAL_COMMUNITY): Payer: Self-pay

## 2022-10-22 ENCOUNTER — Emergency Department (HOSPITAL_COMMUNITY)
Admission: EM | Admit: 2022-10-22 | Discharge: 2022-10-22 | Disposition: A | Discharge status: Home / Self Care | Payer: Commercial Managed Care - PPO | Attending: Emergency Medicine | Admitting: Emergency Medicine

## 2022-10-22 DIAGNOSIS — Z0283 Encounter for blood-alcohol and blood-drug test: Secondary | ICD-10-CM | POA: Diagnosis present

## 2022-10-22 NOTE — Discharge Instructions (Addendum)
Attached is information for resources local in the area to use as needed. Please do not hesitate to return to the ED as needed.
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
## 2022-10-22 NOTE — ED Notes (Signed)
After speaking with PA pt left before medic could obtain another set of vitals
